# Patient Record
Sex: Male | Born: 1995 | Hispanic: Yes | Marital: Single | Smoking: Never smoker
Health system: Southern US, Community
[De-identification: ages and names within clinical notes are randomized; demographics above are authoritative.]

## PROBLEM LIST (undated history)

## (undated) DIAGNOSIS — I10 Essential (primary) hypertension: Secondary | ICD-10-CM

---
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
## 2016-09-30 ENCOUNTER — Emergency Department: Payer: Self-pay

## 2016-09-30 ENCOUNTER — Emergency Department
Admission: EM | Admit: 2016-09-30 | Discharge: 2016-09-30 | Disposition: A | Discharge status: Home / Self Care | Payer: Self-pay | Attending: Emergency Medicine | Admitting: Emergency Medicine

## 2016-09-30 ENCOUNTER — Encounter: Payer: Self-pay | Admitting: Emergency Medicine

## 2016-09-30 DIAGNOSIS — Z23 Encounter for immunization: Secondary | ICD-10-CM | POA: Insufficient documentation

## 2016-09-30 DIAGNOSIS — Y999 Unspecified external cause status: Secondary | ICD-10-CM | POA: Insufficient documentation

## 2016-09-30 DIAGNOSIS — I10 Essential (primary) hypertension: Secondary | ICD-10-CM | POA: Insufficient documentation

## 2016-09-30 DIAGNOSIS — Y9389 Activity, other specified: Secondary | ICD-10-CM | POA: Insufficient documentation

## 2016-09-30 DIAGNOSIS — Y929 Unspecified place or not applicable: Secondary | ICD-10-CM | POA: Insufficient documentation

## 2016-09-30 DIAGNOSIS — W540XXA Bitten by dog, initial encounter: Secondary | ICD-10-CM | POA: Insufficient documentation

## 2016-09-30 DIAGNOSIS — Z203 Contact with and (suspected) exposure to rabies: Secondary | ICD-10-CM | POA: Insufficient documentation

## 2016-09-30 DIAGNOSIS — S41131A Puncture wound without foreign body of right upper arm, initial encounter: Secondary | ICD-10-CM | POA: Insufficient documentation

## 2016-09-30 DIAGNOSIS — Z2914 Encounter for prophylactic rabies immune globin: Secondary | ICD-10-CM | POA: Insufficient documentation

## 2016-09-30 HISTORY — DX: Essential (primary) hypertension: I10

## 2016-09-30 MED ORDER — AMOXICILLIN-POT CLAVULANATE 875-125 MG PO TABS: 1.0000 | ORAL_TABLET | Freq: Two times a day (BID) | ORAL | 0 refills | Status: DC

## 2016-09-30 MED ORDER — TETANUS-DIPHTH-ACELL PERTUSSIS 5-2.5-18.5 LF-MCG/0.5 IM SUSP
INTRAMUSCULAR | Status: AC
  Administered 2016-09-30: 0.5 mL via INTRAMUSCULAR
  Filled 2016-09-30: qty 0.5

## 2016-09-30 MED ORDER — RABIES VACCINE, PCEC IM SUSR
1.0000 mL | Freq: Once | INTRAMUSCULAR | Status: AC
  Administered 2016-09-30: 1 mL via INTRAMUSCULAR
  Filled 2016-09-30: qty 1

## 2016-09-30 MED ORDER — OXYCODONE-ACETAMINOPHEN 5-325 MG PO TABS
1.0000 | ORAL_TABLET | ORAL | Status: DC | PRN
  Administered 2016-09-30: 1 via ORAL

## 2016-09-30 MED ORDER — RABIES IMMUNE GLOBULIN 150 UNIT/ML IM INJ
20.0000 [IU]/kg | INJECTION | Freq: Once | INTRAMUSCULAR | Status: AC
  Administered 2016-09-30: 2175 [IU] via INTRAMUSCULAR
  Filled 2016-09-30: qty 16

## 2016-09-30 MED ORDER — TETANUS-DIPHTH-ACELL PERTUSSIS 5-2.5-18.5 LF-MCG/0.5 IM SUSP
0.5000 mL | Freq: Once | INTRAMUSCULAR | Status: AC
  Administered 2016-09-30: 0.5 mL via INTRAMUSCULAR

## 2016-09-30 MED ORDER — OXYCODONE-ACETAMINOPHEN 5-325 MG PO TABS
ORAL_TABLET | ORAL | Status: AC
  Filled 2016-09-30: qty 1

## 2016-09-30 NOTE — ED Triage Notes (Addendum)
Patient states that he was trying to cover his dog and the dog bit him. Patient with multiple puncture wounds to right shoulder, upper arm and forearm. Patient with swelling and decreased range of motion to forearm and elbow. Patient with positive radial pulse. Patient with abrasion to left lower abd.  Patient reports that the dog's rabies vaccination is not up to date.

## 2016-09-30 NOTE — ED Provider Notes (Signed)
[facility] Emergency Department Provider Note  ____________________________________________  Time seen: Approximately 11:15 PM  I have reviewed the triage vital signs and the nursing notes.   HISTORY  Chief Complaint Arm Pain and Animal Bite    HPI Gary Good is a 21 y.o. male who presents emergency department complaining of dog bite to the right arm. Patient states that he was messing with his dog when the dog bit him. Does not been acting overly aggressive but is not up-to-date on rabies shots. Patient reports that he is having some pain and swelling to the right forearm. He denies any true loss of range of motion. He has been favoring the arm due to the pain. He denies any numbness or tingling distally. Patient is unsure of his last tetanus shot. Patient is requesting rabies vaccine at this time. Patient denies any bleeding. No other injury or complaint.   Past Medical History:  Diagnosis Date  . Hypertension     There are no active problems to display for this patient.   History reviewed. No pertinent surgical history.  Prior to Admission medications   Medication Sig Start Date End Date Taking? Authorizing Provider  amoxicillin-clavulanate (AUGMENTIN) 875-125 MG tablet Take 1 tablet by mouth 2 (two) times daily. 09/30/16   Delorise RoyalsJonathan D Imane Burrough, PA-C    Allergies Patient has no known allergies.  No family history on file.  Social History Social History  Substance Use Topics  . Smoking status: Never Smoker  . Smokeless tobacco: Never Used  . Alcohol use No     Review of Systems  Constitutional: No fever/chills Cardiovascular: no chest pain. Respiratory: no cough. No SOB. Musculoskeletal: Positive for right forearm pain Skin: Also for multiple puncture wounds to the right arm. Neurological: Negative for headaches, focal weakness or numbness. 10-point ROS otherwise  negative.  ____________________________________________   PHYSICAL EXAM:  VITAL SIGNS: ED Triage Vitals  Enc Vitals Group     BP 09/30/16 2134 (!) 143/91     Pulse Rate 09/30/16 2134 (!) 106     Resp 09/30/16 2134 18     Temp 09/30/16 2134 97.5 F (36.4 C)     Temp Source 09/30/16 2134 Oral     SpO2 09/30/16 2134 97 %     Weight 09/30/16 2135 238 lb (108 kg)     Height 09/30/16 2135 5\' 6"  (1.676 m)     Head Circumference --      Peak Flow --      Pain Score 09/30/16 2135 10     Pain Loc --      Pain Edu? --      Excl. in GC? --      Constitutional: Alert and oriented. Well appearing and in no acute distress. Eyes: Conjunctivae are normal. PERRL. EOMI. Head: Atraumatic. Neck: No stridor.    Cardiovascular: Normal rate, regular rhythm. Normal S1 and S2.  Good peripheral circulation. Respiratory: Normal respiratory effort without tachypnea or retractions. Lungs CTAB. Good air entry to the bases with no decreased or absent breath sounds. Musculoskeletal: Full range of motion to all extremities. No gross deformities appreciated. Full range of motion to the right upper injury. Patient does have edema to the proximal right forearm. Full range of motion of the elbow and wrist. Radial pulse and sensation intact distally. Multiple puncture wounds from dog bite. Neurologic:  Normal speech and language. No gross focal neurologic deficits are appreciated.  Skin:  Skin is warm, dry and intact. No rash  noted. Multiple puncture wounds noted to the right upper extremity. No bleeding. No foreign body in any puncture wound. No tearing or lacerations noted. Patient is tender to palpation over each puncture wound. Radial pulse and sensation intact distally. Psychiatric: Mood and affect are normal. Speech and behavior are normal. Patient exhibits appropriate insight and judgement.   ____________________________________________   LABS (all labs ordered are listed, but only abnormal results are  displayed)  Labs Reviewed - No data to display ____________________________________________  EKG   ____________________________________________  RADIOLOGY Festus Barren Jakie Debow, personally viewed and evaluated these images (plain radiographs) as part of my medical decision making, as well as reviewing the written report by the radiologist.  Dg Forearm Right  Result Date: 09/30/2016 CLINICAL DATA:  Dog bite EXAM: RIGHT FOREARM - 2 VIEW COMPARISON:  None. FINDINGS: Frontal and lateral views were obtained. There is no fracture or dislocation. Joint spaces appear normal. No radiopaque foreign bodies are evident. Incidental note is made of a minus ulnar variance. IMPRESSION: No radiopaque foreign body or soft tissue air. No fracture or dislocation. No apparent arthropathy. Incidental note is made of a minus ulnar variance. Electronically Signed   By: Bretta Bang III M.D.   On: 09/30/2016 22:01    ____________________________________________    PROCEDURES  Procedure(s) performed:    Procedures  The wounds are cleansed. The patient is alerted to watch for any signs of infection (redness, pus, pain, increased swelling or fever) and call if such occurs. Home wound care instructions are provided. Tetanus vaccination status reviewed: Td vaccination indicated and given today.   Medications  oxyCODONE-acetaminophen (PERCOCET/ROXICET) 5-325 MG per tablet 1 tablet (1 tablet Oral Given 09/30/16 2140)  rabies immune globulin (HYPERAB) injection 2,175 Units (2,175 Units Intramuscular Given 09/30/16 2249)  rabies vaccine (RABAVERT) injection 1 mL (1 mL Intramuscular Given 09/30/16 2253)  Tdap (BOOSTRIX) injection 0.5 mL (0.5 mLs Intramuscular Given 09/30/16 2255)     ____________________________________________   INITIAL IMPRESSION / ASSESSMENT AND PLAN / ED COURSE  Pertinent labs & imaging results that were available during my care of the patient were reviewed by me and considered in my  medical decision making (see chart for details).  Review of the [place] CSRS was performed in accordance of the [facility] prior to dispensing any controlled drugs.  Clinical Course     Patient's diagnosis is consistent with Dog bite to the right upper extremity. Due to the nature of the injury, no closure of any puncture wound. No tearing requiring sutures stabilization. Patient is given rabies vaccine and immunoglobin tonight. His tetanus shot is updated at this time. Patient is given instructions to follow up on day 3, 7, 14 for further rabies vaccine.. Patient will be discharged home with prescriptions for Augmentin. Patient is given ED precautions to return to the ED for any worsening or new symptoms.     ____________________________________________  FINAL CLINICAL IMPRESSION(S) / ED DIAGNOSES  Final diagnoses:  Dog bite, initial encounter  Need for rabies vaccination      NEW MEDICATIONS STARTED DURING THIS VISIT:  New Prescriptions   AMOXICILLIN-CLAVULANATE (AUGMENTIN) 875-125 MG TABLET    Take 1 tablet by mouth 2 (two) times daily.        This chart was dictated using voice recognition software/Dragon. Despite best efforts to proofread, errors can occur which can change the meaning. Any change was purely unintentional.    Racheal Patches, PA-C 09/30/16 1610    Minna Antis, MD 09/30/16 [phone]

## 2016-09-30 NOTE — ED Notes (Signed)
Pt discharged to home.  Family member driving.  Discharge instructions reviewed.  Verbalized understanding.  No questions or concerns at this time.  Teach back verified.  Pt in NAD.  No items left in ED.   

## 2016-10-03 ENCOUNTER — Encounter: Payer: Self-pay | Admitting: Emergency Medicine

## 2016-10-03 ENCOUNTER — Emergency Department
Admission: EM | Admit: 2016-10-03 | Discharge: 2016-10-03 | Disposition: A | Discharge status: Home / Self Care | Payer: Self-pay | Attending: Emergency Medicine | Admitting: Emergency Medicine

## 2016-10-03 DIAGNOSIS — Z203 Contact with and (suspected) exposure to rabies: Secondary | ICD-10-CM | POA: Insufficient documentation

## 2016-10-03 DIAGNOSIS — I10 Essential (primary) hypertension: Secondary | ICD-10-CM | POA: Insufficient documentation

## 2016-10-03 DIAGNOSIS — Z23 Encounter for immunization: Secondary | ICD-10-CM | POA: Insufficient documentation

## 2016-10-03 MED ORDER — RABIES VACCINE, PCEC IM SUSR
1.0000 mL | Freq: Once | INTRAMUSCULAR | Status: AC
  Administered 2016-10-03: 1 mL via INTRAMUSCULAR
  Filled 2016-10-03: qty 1

## 2016-10-03 NOTE — ED Notes (Signed)
Wounds to right arm and shoulder healing, pt reports minimal pain, no drainage, swelling, or redness noted.

## 2016-10-03 NOTE — ED Triage Notes (Signed)
Needs next dose of the rabies injection. No issues with initial dose. Wounds healing, no issues.

## 2016-10-03 NOTE — ED Provider Notes (Signed)
[facility] Emergency Department Provider Note  ____________________________________________  Time seen: Approximately 6:43 PM  I have reviewed the triage vital signs and the nursing notes.   HISTORY  Chief Complaint Rabies Injection    HPI Gary Good is a 21 y.o. male presents to the emergency department for second rabies vaccine. Patient was bit by a dog on Wednesday and received the rabies immunoglobulin and rabies vaccine. Patient denies any swelling, drainage, pain at bite site. Patient is not having any difficulty moving right arm. Patient denies any complications from first rabies vaccine.   Past Medical History:  Diagnosis Date  . Hypertension     There are no active problems to display for this patient.   History reviewed. No pertinent surgical history.  Prior to Admission medications   Medication Sig Start Date End Date Taking? Authorizing Provider  amoxicillin-clavulanate (AUGMENTIN) 875-125 MG tablet Take 1 tablet by mouth 2 (two) times daily. 09/30/16   Delorise Royals Cuthriell, PA-C    Allergies Patient has no known allergies.  History reviewed. No pertinent family history.  Social History Social History  Substance Use Topics  . Smoking status: Never Smoker  . Smokeless tobacco: Never Used  . Alcohol use No     Review of Systems  Constitutional: No fever/chills ENT: No upper respiratory complaints. Cardiovascular: No chest pain. Respiratory: No SOB. Gastrointestinal: No abdominal pain.   Musculoskeletal: Negative for musculoskeletal pain. Neurological: Negative for  numbness or tingling   ____________________________________________   PHYSICAL EXAM:  VITAL SIGNS: ED Triage Vitals  Enc Vitals Group     BP 10/03/16 1751 (!) 151/78     Pulse Rate 10/03/16 1751 95     Resp 10/03/16 1751 18     Temp 10/03/16 1751 (!) 96 F (35.6 C)     Temp Source 10/03/16 1751 Oral     SpO2 10/03/16 1751 99 %     Weight  10/03/16 1751 238 lb (108 kg)     Height 10/03/16 1751 5\' 6"  (1.676 m)     Head Circumference --      Peak Flow --      Pain Score 10/03/16 1752 0     Pain Loc --      Pain Edu? --      Excl. in GC? --      Constitutional: Alert and oriented. Well appearing and in no acute distress. Eyes: Conjunctivae are normal. PERRL. EOMI. Head: Atraumatic. ENT:      Ears:      Nose: No congestion/rhinnorhea.      Mouth/Throat: Mucous membranes are moist.  Neck: No stridor.   Cardiovascular: Normal rate, regular rhythm. Normal S1 and S2.  Good peripheral circulation. 2+ radial pulses. Respiratory: Normal respiratory effort without tachypnea or retractions. Lungs CTAB. Good air entry to the bases with no decreased or absent breath sounds. Musculoskeletal: Full range of motion to all extremities. No gross deformities appreciated. Neurologic:  Normal speech and language. No gross focal neurologic deficits are appreciated.  Skin:  Multiple well-healing lesions across right arm. No erythema or swelling. No drainage. Psychiatric: Mood and affect are normal. Speech and behavior are normal. Patient exhibits appropriate insight and judgement.   ____________________________________________   LABS (all labs ordered are listed, but only abnormal results are displayed)  Labs Reviewed - No data to display ____________________________________________  EKG   ____________________________________________  RADIOLOGY  No results found.  ____________________________________________    PROCEDURES  Procedure(s) performed:    Procedures  Medications  rabies vaccine (RABAVERT) injection 1 mL (1 mL Intramuscular Given 10/03/16 1850)     ____________________________________________   INITIAL IMPRESSION / ASSESSMENT AND PLAN / ED COURSE  Pertinent labs & imaging results that were available during my care of the patient were reviewed by me and considered in my medical decision making (see  chart for details).  Review of the [place] CSRS was performed in accordance of the [facility] prior to dispensing any controlled drugs.  Clinical Course     Patient presented to the emergency department for second rabies vaccine. Patient was instructed to return to the emergency department for third and fourth vaccine. Patient is given ED precautions to return to the ED for any worsening or new symptoms.     ____________________________________________  FINAL CLINICAL IMPRESSION(S) / ED DIAGNOSES  Final diagnoses:  Need for rabies vaccination      NEW MEDICATIONS STARTED DURING THIS VISIT:  Discharge Medication List as of 10/03/2016  7:08 PM          This chart was dictated using voice recognition software/Dragon. Despite best efforts to proofread, errors can occur which can change the meaning. Any change was purely unintentional.    Enid Derryshley Knolan Simien, PA-C 10/03/16 2006    Sharman CheekPhillip Stafford, MD 10/04/16 [phone]

## 2016-10-07 ENCOUNTER — Emergency Department
Admission: EM | Admit: 2016-10-07 | Discharge: 2016-10-07 | Disposition: A | Discharge status: Home / Self Care | Payer: Self-pay | Attending: Student in an Organized Health Care Education/Training Program | Admitting: Student in an Organized Health Care Education/Training Program

## 2016-10-07 DIAGNOSIS — I10 Essential (primary) hypertension: Secondary | ICD-10-CM | POA: Insufficient documentation

## 2016-10-07 DIAGNOSIS — Z792 Long term (current) use of antibiotics: Secondary | ICD-10-CM | POA: Insufficient documentation

## 2016-10-07 DIAGNOSIS — Z23 Encounter for immunization: Secondary | ICD-10-CM | POA: Insufficient documentation

## 2016-10-07 MED ORDER — RABIES VACCINE, PCEC IM SUSR
1.0000 mL | Freq: Once | INTRAMUSCULAR | Status: AC
  Administered 2016-10-07: 1 mL via INTRAMUSCULAR
  Filled 2016-10-07: qty 1

## 2016-10-07 NOTE — ED Provider Notes (Signed)
[facility] Emergency Department Provider Note  ____________________________________________  Time seen: Approximately 9:46 PM  I have reviewed the triage vital signs and the nursing notes.   HISTORY  Chief Complaint Rabies Injection    HPI Gary Good is a 21 y.o. male who presents to the emergency department for his third rabies vaccine. Patient was initially seen by myself following a dog bite. See previous note for details. Patient presents tonight seeking his third rabies vaccine. Patient denies any complications to bite wounds. No other symptoms at this time.   Past Medical History:  Diagnosis Date  . Hypertension     There are no active problems to display for this patient.   History reviewed. No pertinent surgical history.  Prior to Admission medications   Medication Sig Start Date End Date Taking? Authorizing Provider  amoxicillin-clavulanate (AUGMENTIN) 875-125 MG tablet Take 1 tablet by mouth 2 (two) times daily. 09/30/16   Delorise RoyalsJonathan D Cuthriell, PA-C    Allergies Patient has no known allergies.  No family history on file.  Social History Social History  Substance Use Topics  . Smoking status: Never Smoker  . Smokeless tobacco: Never Used  . Alcohol use No     Review of Systems  Constitutional: No fever/chills Cardiovascular: no chest pain. Respiratory: no cough. No SOB. Gastrointestinal: No abdominal pain.  No nausea, no vomiting.  No diarrhea.  No constipation. Musculoskeletal: Negative for musculoskeletal pain. Skin: Negative for rash, abrasions, lacerations, ecchymosis. Neurological: Negative for headaches, focal weakness or numbness. 10-point ROS otherwise negative.  ____________________________________________   PHYSICAL EXAM:  VITAL SIGNS: ED Triage Vitals  Enc Vitals Group     BP 10/07/16 2122 135/86     Pulse Rate 10/07/16 2122 66     Resp 10/07/16 2122 16     Temp 10/07/16 2122 98.2 F (36.8 C)     Temp Source 10/07/16 2122 Oral     SpO2 10/07/16 2122 99 %     Weight 10/07/16 2123 238 lb (108 kg)     Height 10/07/16 2123 5\' 6"  (1.676 m)     Head Circumference --      Peak Flow --      Pain Score 10/07/16 2123 0     Pain Loc --      Pain Edu? --      Excl. in GC? --      Constitutional: Alert and oriented. Well appearing and in no acute distress. Eyes: Conjunctivae are normal. PERRL. EOMI. Head: Atraumatic. Neck: No stridor.    Cardiovascular: Normal rate, regular rhythm. Normal S1 and S2.  Good peripheral circulation. Respiratory: Normal respiratory effort without tachypnea or retractions. Lungs CTAB. Good air entry to the bases with no decreased or absent breath sounds. Musculoskeletal: Full range of motion to all extremities. No gross deformities appreciated. Neurologic:  Normal speech and language. No gross focal neurologic deficits are appreciated.  Skin:  Skin is warm, dry and intact. No rash noted.Bite wounds to the right forearm are healing appropriately with no signs of infection. Psychiatric: Mood and affect are normal. Speech and behavior are normal. Patient exhibits appropriate insight and judgement.   ____________________________________________   LABS (all labs ordered are listed, but only abnormal results are displayed)  Labs Reviewed - No data to display ____________________________________________  EKG   ____________________________________________  RADIOLOGY   No results found.  ____________________________________________    PROCEDURES  Procedure(s) performed:    Procedures    Medications  rabies vaccine (RABAVERT) injection  1 mL (1 mL Intramuscular Given 10/07/16 2156)     ____________________________________________   INITIAL IMPRESSION / ASSESSMENT AND PLAN / ED COURSE  Pertinent labs & imaging results that were available during my care of the patient were reviewed by me and considered in my medical decision making (see  chart for details).  Review of the [employer] CSRS was performed in accordance of the [facility] prior to dispensing any controlled drugs.  Clinical Course     Patient's diagnosis is consistent with Encounter for rabies vaccination. This is patient's third vaccination in the series. Dog bite wounds are healing appropriately with no signs of infection. Patient will follow-up in one week for last rabies vaccine.. Patient is given ED precautions to return to the ED for any worsening or new symptoms.     ____________________________________________  FINAL CLINICAL IMPRESSION(S) / ED DIAGNOSES  Final diagnoses:  Need for rabies vaccination      NEW MEDICATIONS STARTED DURING THIS VISIT:  Discharge Medication List as of 10/07/2016  9:47 PM          This chart was dictated using voice recognition software/Dragon. Despite best efforts to proofread, errors can occur which can change the meaning. Any change was purely unintentional.    Racheal Patches, PA-C 10/08/16 0139    Willy Eddy, MD 10/08/16 [phone]

## 2016-10-07 NOTE — ED Triage Notes (Signed)
Pt here for 3 round of rabies vaccine.  A/OX4, resp and unlabored. NAD

## 2016-10-16 ENCOUNTER — Emergency Department
Admission: EM | Admit: 2016-10-16 | Discharge: 2016-10-16 | Disposition: A | Discharge status: Home / Self Care | Payer: Self-pay | Attending: Emergency Medicine | Admitting: Emergency Medicine

## 2016-10-16 ENCOUNTER — Encounter: Payer: Self-pay | Admitting: Emergency Medicine

## 2016-10-16 DIAGNOSIS — Z203 Contact with and (suspected) exposure to rabies: Secondary | ICD-10-CM | POA: Insufficient documentation

## 2016-10-16 DIAGNOSIS — Z23 Encounter for immunization: Secondary | ICD-10-CM | POA: Insufficient documentation

## 2016-10-16 DIAGNOSIS — I10 Essential (primary) hypertension: Secondary | ICD-10-CM | POA: Insufficient documentation

## 2016-10-16 MED ORDER — RABIES VACCINE, PCEC IM SUSR
1.0000 mL | Freq: Once | INTRAMUSCULAR | Status: AC
  Administered 2016-10-16: 1 mL via INTRAMUSCULAR
  Filled 2016-10-16: qty 1

## 2016-10-16 NOTE — ED Notes (Signed)
See triage note  Here for rabies vaccine 

## 2016-10-16 NOTE — ED Provider Notes (Signed)
[facility] Emergency Department Provider Note ____________________________________________  Time seen: 1615  I have reviewed the triage vital signs and the nursing notes.  HISTORY  Chief Complaint  Rabies Injection  HPI Gary Good is a 21 y.o. male resistance to the ED for rabies vaccine #4 of 4. The patient denies any interim complaints. He is finishing his previously prescribed antibiotic without any problems.  Past Medical History:  Diagnosis Date  . Hypertension     There are no active problems to display for this patient.   History reviewed. No pertinent surgical history.  Prior to Admission medications   Medication Sig Start Date End Date Taking? Authorizing Provider  amoxicillin-clavulanate (AUGMENTIN) 875-125 MG tablet Take 1 tablet by mouth 2 (two) times daily. 09/30/16   Delorise RoyalsJonathan D Cuthriell, PA-C    Allergies Patient has no known allergies.  No family history on file.  Social History Social History  Substance Use Topics  . Smoking status: Never Smoker  . Smokeless tobacco: Never Used  . Alcohol use No    Review of Systems  Constitutional: Negative for fever. Cardiovascular: Negative for chest pain. Respiratory: Negative for shortness of breath. Gastrointestinal: Negative for abdominal pain, vomiting and diarrhea. Musculoskeletal: Negative for back pain. Skin: Negative for rash. Neurological: Negative for headaches, focal weakness or numbness. ____________________________________________  PHYSICAL EXAM:  VITAL SIGNS: ED Triage Vitals  Enc Vitals Group     BP 10/16/16 1501 (!) 173/80     Pulse Rate 10/16/16 1501 (!) 107     Resp 10/16/16 1501 (!) 21     Temp --      Temp src --      SpO2 10/16/16 1501 98 %     Weight 10/16/16 1459 238 lb (108 kg)     Height 10/16/16 1459 5\' 6"  (1.676 m)     Head Circumference --      Peak Flow --      Pain Score 10/16/16 1459 0     Pain Loc --      Pain Edu? --      Excl.  in GC? --    Constitutional: Alert and oriented. Well appearing and in no distress. Head: Normocephalic and atraumatic. Eyes: Conjunctivae are normal. PERRL. Normal extraocular movements Respiratory: Normal respiratory effort.  Musculoskeletal: Nontender with normal range of motion in all extremities.  Neurologic:  Normal gait without ataxia. Normal speech and language. No gross focal neurologic deficits are appreciated. Skin:  Skin is warm, dry and intact. No rash noted. Psychiatric: Mood and affect are normal. Patient exhibits appropriate insight and judgment. ____________________________________________  PROCEDURES  Rabies Vaccine 1 ml IM ____________________________________________  INITIAL IMPRESSION / ASSESSMENT AND PLAN / ED COURSE  Patient for completion of post-exposure prophylaxis for rabies. [facility]   ____________________________________________  FINAL CLINICAL IMPRESSION(S) / ED DIAGNOSES  Final diagnoses:  Encounter for repeat administration of rabies vaccination      Lissa HoardJenise V Bacon Klaus Casteneda, PA-C 10/16/16 1622    Sharyn CreamerMark Quale, MD 10/17/16 2337

## 2016-10-16 NOTE — ED Triage Notes (Signed)
Pt here for 4th set of rabies shot.

## 2017-05-29 ENCOUNTER — Encounter: Payer: Self-pay | Admitting: Emergency Medicine

## 2017-05-29 ENCOUNTER — Emergency Department
Admission: EM | Admit: 2017-05-29 | Discharge: 2017-05-29 | Disposition: A | Discharge status: Home / Self Care | Payer: Self-pay | Attending: Emergency Medicine | Admitting: Emergency Medicine

## 2017-05-29 DIAGNOSIS — S61210D Laceration without foreign body of right index finger without damage to nail, subsequent encounter: Secondary | ICD-10-CM | POA: Insufficient documentation

## 2017-05-29 DIAGNOSIS — Z4802 Encounter for removal of sutures: Secondary | ICD-10-CM | POA: Insufficient documentation

## 2017-05-29 DIAGNOSIS — I10 Essential (primary) hypertension: Secondary | ICD-10-CM | POA: Insufficient documentation

## 2017-05-29 DIAGNOSIS — X58XXXD Exposure to other specified factors, subsequent encounter: Secondary | ICD-10-CM | POA: Insufficient documentation

## 2017-05-29 DIAGNOSIS — Z79899 Other long term (current) drug therapy: Secondary | ICD-10-CM | POA: Insufficient documentation

## 2017-05-29 NOTE — ED Notes (Signed)
Sutures removed; pt tolerated it well. No apparent distress noted.

## 2017-05-29 NOTE — ED Triage Notes (Signed)
Pt to ed for suture removal from right hand second digit.

## 2017-05-29 NOTE — ED Notes (Signed)

## 2017-05-29 NOTE — ED Provider Notes (Signed)
[facility] Emergency Department Provider Note  ____________________________________________  Time seen: Approximately 3:26 PM  I have reviewed the triage vital signs and the nursing notes.   HISTORY  Chief Complaint Suture / Staple Removal    HPI Gary Good is a 21 y.o. male presenting to the emergency department for suture removal of the right second digit. He denies discharge or purulent exudate from healing laceration. Patient has healed without incident. He has no concerns.   Past Medical History:  Diagnosis Date  . Hypertension     There are no active problems to display for this patient.   History reviewed. No pertinent surgical history.  Prior to Admission medications   Medication Sig Start Date End Date Taking? Authorizing Provider  amoxicillin-clavulanate (AUGMENTIN) 875-125 MG tablet Take 1 tablet by mouth 2 (two) times daily. 09/30/16   Cuthriell, Delorise Royals, PA-C    Allergies Patient has no known allergies.  History reviewed. No pertinent family history.  Social History Social History  Substance Use Topics  . Smoking status: Never Smoker  . Smokeless tobacco: Never Used  . Alcohol use No     Review of Systems  Constitutional: No fever/chills Eyes: No visual changes. No discharge ENT: No upper respiratory complaints. Cardiovascular: no chest pain. Respiratory: no cough. No SOB. Skin: Patient has healing right 2nd digit laceration.  Neurological: Negative for headaches, focal weakness or numbness.   ____________________________________________   PHYSICAL EXAM:  VITAL SIGNS: ED Triage Vitals [05/29/17 1452]  Enc Vitals Group     BP (!) 150/88     Pulse Rate 68     Resp 18     Temp 98 F (36.7 C)     Temp Source Oral     SpO2 98 %     Weight 238 lb (108 kg)     Height      Head Circumference      Peak Flow      Pain Score 0     Pain Loc      Pain Edu?      Excl. in GC?     Constitutional: Alert  and oriented. Well appearing and in no acute distress. Eyes: Conjunctivae are normal. PERRL. EOMI. Head: Atraumatic. Cardiovascular: Normal rate, regular rhythm. Normal S1 and S2.  Good peripheral circulation. Respiratory: Normal respiratory effort without tachypnea or retractions. Lungs CTAB. Good air entry to the bases with no decreased or absent breath sounds. Musculoskeletal: Full range of motion to all extremities. No gross deformities appreciated. Neurologic:  Normal speech and language. No gross focal neurologic deficits are appreciated.  Skin: Patient has healing right 2nd digit laceration.  ____________________________________________   LABS (all labs ordered are listed, but only abnormal results are displayed)  Labs Reviewed - No data to display ____________________________________________  EKG   ____________________________________________  RADIOLOGY   No results found.  ____________________________________________    PROCEDURES  Procedure(s) performed:    Procedures    Medications - No data to display   ____________________________________________   INITIAL IMPRESSION / ASSESSMENT AND PLAN / ED COURSE  Pertinent labs & imaging results that were available during my care of the patient were reviewed by me and considered in my medical decision making (see chart for details).  Review of the [place] CSRS was performed in accordance of the [facility] prior to dispensing any controlled drugs.    Assessment and Plan:  Suture Removal  Patient underwent suture removal from the right second digit without complication. All patient  questions were answered. ____________________________________________  FINAL CLINICAL IMPRESSION(S) / ED DIAGNOSES  Final diagnoses:  Visit for suture removal      NEW MEDICATIONS STARTED DURING THIS VISIT:  New Prescriptions   No medications on file        This chart was dictated using voice recognition software/Dragon.  Despite best efforts to proofread, errors can occur which can change the meaning. Any change was purely unintentional.    Orvil FeilWoods, Leo Weyandt M, PA-C 05/29/17 1535    Arnaldo NatalMalinda, Paul F, MD 05/30/17 2120

## 2018-08-27 ENCOUNTER — Other Ambulatory Visit: Payer: Self-pay

## 2018-08-27 ENCOUNTER — Emergency Department: Payer: Self-pay

## 2018-08-27 ENCOUNTER — Emergency Department
Admission: EM | Admit: 2018-08-27 | Discharge: 2018-08-27 | Disposition: A | Discharge status: Home / Self Care | Payer: Self-pay | Attending: Emergency Medicine | Admitting: Emergency Medicine

## 2018-08-27 ENCOUNTER — Encounter: Payer: Self-pay | Admitting: Emergency Medicine

## 2018-08-27 DIAGNOSIS — I10 Essential (primary) hypertension: Secondary | ICD-10-CM | POA: Insufficient documentation

## 2018-08-27 DIAGNOSIS — L02611 Cutaneous abscess of right foot: Secondary | ICD-10-CM | POA: Insufficient documentation

## 2018-08-27 DIAGNOSIS — R7309 Other abnormal glucose: Secondary | ICD-10-CM

## 2018-08-27 LAB — CBC WITH DIFFERENTIAL/PLATELET
ABS IMMATURE GRANULOCYTES: 0.06 10*3/uL (ref 0.00–0.07)
BASOS ABS: 0.1 10*3/uL (ref 0.0–0.1)
Basophils Relative: 0 %
Eosinophils Absolute: 0.2 10*3/uL (ref 0.0–0.5)
Eosinophils Relative: 2 %
HCT: 49.8 % (ref 39.0–52.0)
HEMOGLOBIN: 16.1 g/dL (ref 13.0–17.0)
IMMATURE GRANULOCYTES: 1 %
LYMPHS PCT: 23 %
Lymphs Abs: 2.7 10*3/uL (ref 0.7–4.0)
MCH: 27.2 pg (ref 26.0–34.0)
MCHC: 32.3 g/dL (ref 30.0–36.0)
MCV: 84.3 fL (ref 80.0–100.0)
MONO ABS: 0.7 10*3/uL (ref 0.1–1.0)
Monocytes Relative: 6 %
NEUTROS ABS: 8.1 10*3/uL — AB (ref 1.7–7.7)
NEUTROS PCT: 68 %
Platelets: 273 10*3/uL (ref 150–400)
RBC: 5.91 MIL/uL — AB (ref 4.22–5.81)
RDW: 12.4 % (ref 11.5–15.5)
WBC: 11.8 10*3/uL — ABNORMAL HIGH (ref 4.0–10.5)
nRBC: 0 % (ref 0.0–0.2)

## 2018-08-27 LAB — COMPREHENSIVE METABOLIC PANEL
ALBUMIN: 4.5 g/dL (ref 3.5–5.0)
ALK PHOS: 74 U/L (ref 38–126)
ALT: 57 U/L — AB (ref 0–44)
ANION GAP: 11 (ref 5–15)
AST: 28 U/L (ref 15–41)
BILIRUBIN TOTAL: 0.4 mg/dL (ref 0.3–1.2)
BUN: 9 mg/dL (ref 6–20)
CALCIUM: 9.1 mg/dL (ref 8.9–10.3)
CO2: 22 mmol/L (ref 22–32)
CREATININE: 0.69 mg/dL (ref 0.61–1.24)
Chloride: 103 mmol/L (ref 98–111)
GFR calc non Af Amer: >60 mL/min (ref >60)
GLUCOSE: 234 mg/dL — AB (ref 70–99)
Potassium: 3.7 mmol/L (ref 3.5–5.1)
Sodium: 136 mmol/L (ref 135–145)
TOTAL PROTEIN: 7.9 g/dL (ref 6.5–8.1)

## 2018-08-27 LAB — CG4 I-STAT (LACTIC ACID): LACTIC ACID, VENOUS: 1.75 mmol/L (ref 0.5–1.9)

## 2018-08-27 LAB — CK: CK TOTAL: 108 U/L (ref 49–397)

## 2018-08-27 MED ORDER — HYDROCODONE-ACETAMINOPHEN 5-325 MG PO TABS: 1.0000 | ORAL_TABLET | ORAL | 0 refills | Status: AC | PRN

## 2018-08-27 MED ORDER — CLINDAMYCIN HCL 300 MG PO CAPS: 300.0000 mg | ORAL_CAPSULE | Freq: Four times a day (QID) | ORAL | 0 refills | Status: AC

## 2018-08-27 MED ORDER — CLINDAMYCIN PHOSPHATE 900 MG/50ML IV SOLN
900.0000 mg | Freq: Once | INTRAVENOUS | Status: AC
  Administered 2018-08-27: 900 mg via INTRAVENOUS
  Filled 2018-08-27: qty 50

## 2018-08-27 MED ORDER — SODIUM CHLORIDE 0.9 % IV BOLUS
1000.0000 mL | Freq: Once | INTRAVENOUS | Status: AC
  Administered 2018-08-27: 1000 mL via INTRAVENOUS

## 2018-08-27 MED ORDER — IOPAMIDOL (ISOVUE-300) INJECTION 61%
100.0000 mL | Freq: Once | INTRAVENOUS | Status: AC | PRN
  Administered 2018-08-27: 100 mL via INTRAVENOUS
  Filled 2018-08-27: qty 100

## 2018-08-27 NOTE — ED Triage Notes (Signed)
Pt reports that he noticed some pain and a cut from unknown source on his right middle toe. Pt has noted swelling and wound of the same toe. Pt is in NAD.

## 2018-08-27 NOTE — ED Provider Notes (Signed)
[facility] Emergency Department Provider Note  ____________________________________________  Time seen: Approximately 7:33 PM  I have reviewed the triage vital signs and the nursing notes.   HISTORY  Chief Complaint Wound Infection    HPI Gary Good is a 22 y.o. male who presents the emergency department for an infected third digit of the right foot.  Patient reports that he noticed erythema and edema to the third digit of his right foot yesterday.  Patient reports that today the swelling and erythema increased, and open wound formed to the plantar aspect of the digit as well as extending erythema, edema, pain into the midfoot region.  Patient denies any trauma to the digit.  He denies stepping on anything.  Patient  denies any history of diabetes.  He denies any previous skin infections.  Patient denies any fevers or chills, chest pain, shortness of breath, abdominal pain, nausea or vomiting.  No medications for his complaint prior to arrival.  Patient reports he is up-to-date on all immunizations.   Past Medical History:  Diagnosis Date  . Hypertension     There are no active problems to display for this patient.   History reviewed. No pertinent surgical history.  Prior to Admission medications   Medication Sig Start Date End Date Taking? Authorizing Provider  amoxicillin-clavulanate (AUGMENTIN) 875-125 MG tablet Take 1 tablet by mouth 2 (two) times daily. 09/30/16   Cuthriell, Delorise Royals, PA-C  clindamycin (CLEOCIN) 300 MG capsule Take 1 capsule (300 mg total) by mouth 4 (four) times daily. 08/27/18   Cuthriell, Delorise Royals, PA-C  HYDROcodone-acetaminophen (NORCO/VICODIN) 5-325 MG tablet Take 1 tablet by mouth every 4 (four) hours as needed for moderate pain. 08/27/18   Cuthriell, Delorise Royals, PA-C    Allergies Patient has no known allergies.  No family history on file.  Social History Social History   Tobacco Use  . Smoking status: Never  Smoker  . Smokeless tobacco: Never Used  Substance Use Topics  . Alcohol use: No  . Drug use: No     Review of Systems  Constitutional: No fever/chills Eyes: No visual changes.  Cardiovascular: no chest pain. Respiratory: no cough. No SOB. Gastrointestinal: No abdominal pain.  No nausea, no vomiting.  Musculoskeletal: Positive for pain, erythema, edema to the third digit of the right foot with and now open wound without trauma, pain, erythema, edema extends into the metatarsal region. Skin: Negative for rash, abrasions, lacerations, ecchymosis. Neurological: Negative for headaches, focal weakness or numbness. 10-point ROS otherwise negative.  ____________________________________________   PHYSICAL EXAM:  VITAL SIGNS: ED Triage Vitals  Enc Vitals Group     BP 08/27/18 1911 (!) 171/101     Pulse Rate 08/27/18 1911 98     Resp 08/27/18 1911 20     Temp 08/27/18 1911 99.1 F (37.3 C)     Temp Source 08/27/18 1911 Oral     SpO2 08/27/18 1911 98 %     Weight 08/27/18 1912 230 lb (104.3 kg)     Height 08/27/18 1912 5\' 6"  (1.676 m)     Head Circumference --      Peak Flow --      Pain Score 08/27/18 1911 5     Pain Loc --      Pain Edu? --      Excl. in GC? --      Constitutional: Alert and oriented. Well appearing and in no acute distress. Eyes: Conjunctivae are normal. PERRL. EOMI. Head: Atraumatic. ENT:  Ears:       Nose: No congestion/rhinnorhea.      Mouth/Throat: Mucous membranes are moist.  Neck: No stridor.    Cardiovascular: Normal rate, regular rhythm. Normal S1 and S2.  Good peripheral circulation. Respiratory: Normal respiratory effort without tachypnea or retractions. Lungs CTAB. Good air entry to the bases with no decreased or absent breath sounds. Musculoskeletal: Full range of motion to all extremities. No gross deformities appreciated.  Visualization of the right foot reveals edema, erythema grossly compared to nonaffected foot.  Patient has  significant erythema, edema to the third digit with an open nontraumatic skin lesion to the plantar aspect.  Area is oozing both pus and serosanguineous fluid.  Palpation does not drastically increase pus or serosanguineous fluid drainage.  Area is exquisitely tender to palpation.  Erythema, edema extends from digit into the midfoot region.  Erythema and edema and approximately over the metatarsal head region.  Significant pain with any flexion or extension of the digit.  Sensation intact to the digit.  Capillary refill intact to the digit.  No other visible abnormality to the right foot.  Dorsalis pedis pulse intact. Neurologic:  Normal speech and language. No gross focal neurologic deficits are appreciated.  Skin:  Skin is warm, dry and intact. No rash noted. Psychiatric: Mood and affect are normal. Speech and behavior are normal. Patient exhibits appropriate insight and judgement.   ____________________________________________   LABS (all labs ordered are listed, but only abnormal results are displayed)  Labs Reviewed  COMPREHENSIVE METABOLIC PANEL - Abnormal; Notable for the following components:      Result Value   Glucose, Bld 234 (*)    ALT 57 (*)    All other components within normal limits  CBC WITH DIFFERENTIAL/PLATELET - Abnormal; Notable for the following components:   WBC 11.8 (*)    RBC 5.91 (*)    Neutro Abs 8.1 (*)    All other components within normal limits  CULTURE, BLOOD (ROUTINE X 2)  CULTURE, BLOOD (ROUTINE X 2)  CK  C-REACTIVE PROTEIN  I-STAT CG4 LACTIC ACID, ED  CG4 I-STAT (LACTIC ACID)   ____________________________________________  EKG   ____________________________________________  RADIOLOGY I personally viewed and evaluated these images as part of my medical decision making, as well as reviewing the written report by the radiologist.  Ct Foot Right W Contrast  Result Date: 08/27/2018 CLINICAL DATA:  Pt reports that he noticed some pain and a cut  from unknown source on his right middle toe. Pt has noted swelling and wound of the same toe. EXAM: CT OF THE LOWER RIGHT EXTREMITY WITH CONTRAST TECHNIQUE: Multidetector CT imaging of the lower right extremity was performed according to the standard protocol following intravenous contrast administration. COMPARISON:  None. CONTRAST:  ISOVUE-300 IOPAMIDOL (ISOVUE-300) INJECTION 61% FINDINGS: Bones/Joint/Cartilage No fracture.  No bone lesion. There is no bone resorption to suggest osteomyelitis. Joints are normally spaced and aligned.  No arthropathic changes. Ligaments No evidence of ligament disruption. Ligaments suboptimally assessed by CT. Muscles and Tendons Normal attenuation of the intrinsic foot musculature. Normal appearance of the ankle and foot tendons. Soft tissues There is soft tissue swelling of the third toe primarily along its plantar, proximal aspect. In this area there is somewhat well-defined fluid attenuation, which may reflect an abscess. There is no radiopaque foreign body. Is no soft tissue air as noted above, there is no evidence osteomyelitis the phalanges of the third toe. There is focal edema along the plantar margin the lateral  forefoot adjacent to the fifth metatarsophalangeal joint. Milder subcutaneous edema is noted throughout the forefoot. IMPRESSION: 1. No evidence of osteomyelitis. 2. Third toe is swollen with an area somewhat well-defined fluid attenuation, which lies along the plantar aspect of the proximal third toe. This may reflect an abscess. There is no radiopaque foreign body and there is no soft tissue air. Electronically Signed   By: Amie Portland M.D.   On: 08/27/2018 21:45   Dg Foot Complete Right  Result Date: 08/27/2018 CLINICAL DATA:  infection, pain/edema/erythema to 3rd digit extending into metatarsal region; nki EXAM: RIGHT FOOT COMPLETE - 3+ VIEW COMPARISON:  None. FINDINGS: No fracture or bone lesion. The joints are normally spaced and aligned. No  arthropathic changes. There is soft tissue swelling that is most evident along the forefoot. No soft tissue air. IMPRESSION: 1. Diffuse soft tissue swelling the forefoot, but no fracture, bone lesion or joint abnormality. Electronically Signed   By: Amie Portland M.D.   On: 08/27/2018 20:03    ____________________________________________    PROCEDURES  Procedure(s) performed:    Procedures    Medications  clindamycin (CLEOCIN) IVPB 900 mg (0 mg Intravenous Stopped 08/27/18 2146)  sodium chloride 0.9 % bolus 1,000 mL (1,000 mLs Intravenous New Bag/Given 08/27/18 2030)  iopamidol (ISOVUE-300) 61 % injection 100 mL (100 mLs Intravenous Contrast Given 08/27/18 2034)     ____________________________________________   INITIAL IMPRESSION / ASSESSMENT AND PLAN / ED COURSE  Pertinent labs & imaging results that were available during my care of the patient were reviewed by me and considered in my medical decision making (see chart for details).  Review of the [place] CSRS was performed in accordance of the NCM[facility] prior to dispensing any controlled drugs.  Clinical Course as of Aug 28 2223  Sat Aug 27, 2018  2005 Patient presents the emergency department with erythematous, edematous, painful toe extending into the midfoot right foot.  Patient denies any trauma to the area but there is an open wound to the plantar aspect of the toe.  This is oozing pus and serosanguineous fluid.  Differential at this time includes cellulitis, abscess, infectious tenosynovitis, gangrene, necrotizing fasciitis, necrotizing cellulitis.  Given findings, patient will be evaluated with blood cultures, labs, x-ray and CT of the right foot.  Patient will be started on antibiotics empirically.   [JC]  2214 Patient's labs and imaging return at this time.  Patient does not have an elevated lactic.  Mildly elevated white blood cell count left shift.  Patient does also have an elevated blood glucose reading.  CT scan reveals no  soft tissue air.  There are findings consistent with cellulitis/abscess.  As this is an open draining wound, no indication for drainage at this time.  Given initial findings, I was concerned for necrotizing cellulitis.  I consulted attending provider, Dr. Derrill Kay who comes over and evaluate the patient as well.  At this time, it appears that this is cellulitic with draining abscess.  As such, patient will be discharged with oral clindamycin.  I have discussed findings with the patient regarding infection.  He verbalizes understanding of same.  Patient again has a elevated glucose reading of 234.  I have discussed diabetes/prediabetes with the patient.  Elevated glucose reading may also be stress hyperglycemia.  Given this, once infection has been managed, patient is to follow-up with primary care for repeat glucose testing.  He verbalized understanding of same.  Patient will follow-up with podiatry in 2 days for recheck of foot  cellulitis.  If he is unable to see podiatry, patient will return to emergency department for wound check.   [JC]    Clinical Course User Index [JC] Cuthriell, Delorise RoyalsJonathan D, PA-C     Patient's diagnosis is consistent with toe abscess.  Patient presents emergency department with edema, erythema, pain of the third toe right foot.  On exam, patient has an opening, draining wound to the plantar aspect of the third digit.  Patient had pain, erythema extending into the metacarpal region.  Evaluation with labs and CT scan were undertaken.  No indication of sepsis.  No indication of infectious tenosynovitis or proximal abscess in the foot.  No indication of osteomyelitis.  Given crepitus of tissue, quick worsening of infection, I had attending provider evaluate the patient as well.  At this time, it is felt that the patient does not have necrotizing cellulitis.  As such, patient will be treated with oral antibiotics at home.  He will have close follow-up with the wound in 2 days with either  podiatry or return to the for reevaluation.  Of note, patient does have elevated blood glucose reading.  Patient does not have a history of diabetes.  Patient had not eaten recently either.  While this would normally be very concerning for diabetes, patient does have an appreciable infection.  I have discussed stress hyperglycemia with the patient as well.  Given the reading of 234 mg/dL patient will follow-up with primary care after infection has resolved for recheck of glucose..  Patient will be discharged with Norco and clindamycin.  Follow-up with podiatry or this department in 2 days for reevaluation.  Patient is given ED precautions to return to the ED for any worsening or new symptoms.     ____________________________________________  FINAL CLINICAL IMPRESSION(S) / ED DIAGNOSES  Final diagnoses:  Abscess of toe of right foot  Elevated glucose level      NEW MEDICATIONS STARTED DURING THIS VISIT:  ED Discharge Orders         Ordered    clindamycin (CLEOCIN) 300 MG capsule  4 times daily     08/27/18 2223    HYDROcodone-acetaminophen (NORCO/VICODIN) 5-325 MG tablet  Every 4 hours PRN     08/27/18 2223              This chart was dictated using voice recognition software/Dragon. Despite best efforts to proofread, errors can occur which can change the meaning. Any change was purely unintentional.    Racheal PatchesCuthriell, Jonathan D, PA-C 08/27/18 2224    Phineas SemenGoodman, Graydon, MD 08/27/18 2245

## 2018-08-27 NOTE — ED Notes (Signed)
Crutches for pt size not avail in ED, orderly has been called and crutches requested by this tech

## 2018-08-28 LAB — C-REACTIVE PROTEIN: CRP: 2.9 mg/dL — ABNORMAL HIGH (ref <1.0)

## 2018-08-29 ENCOUNTER — Encounter: Payer: Self-pay | Admitting: Emergency Medicine

## 2018-08-29 ENCOUNTER — Emergency Department
Admission: EM | Admit: 2018-08-29 | Discharge: 2018-08-29 | Disposition: A | Discharge status: Home / Self Care | Payer: Self-pay | Attending: Emergency Medicine | Admitting: Emergency Medicine

## 2018-08-29 ENCOUNTER — Other Ambulatory Visit: Payer: Self-pay

## 2018-08-29 DIAGNOSIS — Z5189 Encounter for other specified aftercare: Secondary | ICD-10-CM

## 2018-08-29 DIAGNOSIS — M79671 Pain in right foot: Secondary | ICD-10-CM | POA: Insufficient documentation

## 2018-08-29 DIAGNOSIS — I10 Essential (primary) hypertension: Secondary | ICD-10-CM | POA: Insufficient documentation

## 2018-08-29 LAB — GLUCOSE, CAPILLARY: Glucose-Capillary: 107 mg/dL — ABNORMAL HIGH (ref 70–99)

## 2018-08-29 NOTE — ED Triage Notes (Signed)
States he was seen 2 days ago for infection of right foot   States he was seen then for laceration.   Now here for recheck

## 2018-08-29 NOTE — ED Provider Notes (Signed)
[facility] Emergency Department Provider Note   ____________________________________________   First MD Initiated Contact with Patient 08/29/18 1212     (approximate)  I have reviewed the triage vital signs and the nursing notes.   HISTORY  Chief Complaint Foot Pain and Wound Check  HPI Gary Good is a [age] y.o. male presents to the ED for a recheck of his infected right foot.  She was seen in the ED on 08/27/2018 for an infection of his third digit right foot.  Patient was placed on clindamycin and states that his foot infection is greatly improved.  In looking over his last visit to the ED his blood sugar was elevated at 234 nonfasting.  Patient denies any history of diabetes.  He denies any fever, chills, nausea or vomiting.  Patient has had minimal drainage from the wound area and states that he has been cleaning it daily.  Also on his last visit his glucose fingerstick was elevated with the last test being elevated at 234.  Patient did not have a history of diabetes and this was felt to be stress related.   Past Medical History:  Diagnosis Date  . Hypertension     There are no active problems to display for this patient.   History reviewed. No pertinent surgical history.  Prior to Admission medications   Medication Sig Start Date End Date Taking? Authorizing Provider  clindamycin (CLEOCIN) 300 MG capsule Take 300 mg by mouth 3 (three) times daily.   Yes [provider]  HYDROcodone-acetaminophen (NORCO/VICODIN) 5-325 MG tablet Take 1 tablet by mouth every 6 (six) hours as needed for moderate pain.   Yes [provider]  clindamycin (CLEOCIN) 300 MG capsule Take 1 capsule (300 mg total) by mouth 4 (four) times daily. 08/27/18   Cuthriell, Delorise RoyalsJonathan D, PA-C  HYDROcodone-acetaminophen (NORCO/VICODIN) 5-325 MG tablet Take 1 tablet by mouth every 4 (four) hours as needed for moderate pain. 08/27/18   Cuthriell, Delorise RoyalsJonathan D, PA-C     Allergies Patient has no known allergies.  No family history on file.  Social History Social History   Tobacco Use  . Smoking status: Never Smoker  . Smokeless tobacco: Never Used  Substance Use Topics  . Alcohol use: No  . Drug use: No    Review of Systems Constitutional: No fever/chills Cardiovascular: Denies chest pain. Respiratory: Denies shortness of breath. Genitourinary: Negative for dysuria. Musculoskeletal: Positive for right foot pain. Skin: Positive for right foot infection. Neurological: Negative for headaches, focal weakness or numbness. ____________________________________________   PHYSICAL EXAM:  VITAL SIGNS: ED Triage Vitals  Enc Vitals Group     BP      Pulse      Resp      Temp      Temp src      SpO2      Weight      Height      Head Circumference      Peak Flow      Pain Score      Pain Loc      Pain Edu?      Excl. in GC?    Constitutional: Alert and oriented. Well appearing and in no acute distress. Eyes: Conjunctivae are normal.  Head: Atraumatic. Neck: No stridor.   Cardiovascular: Normal rate, regular rhythm. Grossly normal heart sounds.  Good peripheral circulation. Respiratory: Normal respiratory effort.  No retractions. Lungs CTAB. Musculoskeletal: On examination of the right foot there is no gross  deformity and there is minimal soft tissue swelling present.  On the plantar aspect of the third digit at the base there is a healing wound with minimal drainage.  Motor or sensory function intact.  Capillary refill is less than 3 seconds.  Area is without erythema or warmth to palpation.  No pitting edema present.  Patient states that this is much improved from his last visit and he currently continues taking antibiotics. Neurologic:  Normal speech and language. No gross focal neurologic deficits are appreciated. No gait instability. Skin:  Skin is warm, dry.  Healing wound right third digit as noted above. Psychiatric: Mood and  affect are normal. Speech and behavior are normal.  ____________________________________________   LABS (all labs ordered are listed, but only abnormal results are displayed)  Labs Reviewed  GLUCOSE, CAPILLARY - Abnormal; Notable for the following components:      Result Value   Glucose-Capillary 107 (*)    All other components within normal limits  CBG MONITORING, ED     PROCEDURES  Procedure(s) performed: None  Procedures  Critical Care performed: No  ____________________________________________   INITIAL IMPRESSION / ASSESSMENT AND PLAN / ED COURSE  As part of my medical decision making, I reviewed the following data within the electronic MEDICAL RECORD NUMBER Notes from prior ED visits and [place] Controlled Substance Database  Patient presents to the ED for wound recheck.  He was seen 2 days ago which time he was placed on clindamycin for an infection to his right third toe.  Patient states that area has improved greatly.  He denies any fever, chills, nausea or vomiting.  He continues to take clindamycin without any difficulties.  On examination area is without erythema or warmth.  There is no active drainage at this time.  Patient is to continue taking the antibiotic until completely finished.  At this time he will begin doing warm compresses or soaks once a day and continue cleaning his foot daily.  He states that the podiatry people are unable to see him until December 20.  He was given a list of clinics that he is eligible for since he does not have any  insurance.  ____________________________________________   FINAL CLINICAL IMPRESSION(S) / ED DIAGNOSES  Final diagnoses:  Visit for wound check     ED Discharge Orders    None       Note:  This document was prepared using Dragon voice recognition software and may include unintentional dictation errors.    Tommi Rumps, PA-C 08/29/18 1312    Emily Filbert, MD 08/29/18 [phone]

## 2018-08-29 NOTE — Discharge Instructions (Signed)
Continue to clean daily with mild soap and water.  Soak foot in warm water once a day.  Continue antibiotics until completely finished.  Elevate foot as needed for any swelling.  Also contact 1 of the clinics listed on your discharge papers.  [facility].  Also you may contact [place] [facility] and [facility] to establish being a patient.

## 2018-09-01 LAB — CULTURE, BLOOD (ROUTINE X 2)
CULTURE: NO GROWTH
Culture: NO GROWTH
Special Requests: ADEQUATE
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
## 2023-09-25 ENCOUNTER — Encounter (HOSPITAL_COMMUNITY): Payer: Self-pay

## 2023-09-25 ENCOUNTER — Emergency Department (HOSPITAL_COMMUNITY): Payer: Self-pay

## 2023-09-25 ENCOUNTER — Other Ambulatory Visit: Payer: Self-pay

## 2023-09-25 ENCOUNTER — Emergency Department (HOSPITAL_COMMUNITY)
Admission: EM | Admit: 2023-09-25 | Discharge: 2023-09-26 | Disposition: A | Discharge status: Home / Self Care | Payer: Self-pay | Attending: Emergency Medicine | Admitting: Emergency Medicine

## 2023-09-25 DIAGNOSIS — M25531 Pain in right wrist: Secondary | ICD-10-CM | POA: Insufficient documentation

## 2023-09-25 DIAGNOSIS — Y92410 Unspecified street and highway as the place of occurrence of the external cause: Secondary | ICD-10-CM | POA: Diagnosis not present

## 2023-09-25 DIAGNOSIS — I10 Essential (primary) hypertension: Secondary | ICD-10-CM | POA: Diagnosis not present

## 2023-09-25 MED ORDER — NAPROXEN 500 MG PO TABS: 500.0000 mg | ORAL_TABLET | Freq: Two times a day (BID) | ORAL | 0 refills | Status: AC

## 2023-09-25 MED ORDER — NAPROXEN 500 MG PO TABS
500.0000 mg | ORAL_TABLET | Freq: Once | ORAL | Status: AC
  Administered 2023-09-25: 500 mg via ORAL
  Filled 2023-09-25: qty 1

## 2023-09-25 MED ORDER — METHOCARBAMOL 500 MG PO TABS: 500.0000 mg | ORAL_TABLET | Freq: Two times a day (BID) | ORAL | 0 refills | Status: AC

## 2023-09-25 NOTE — Discharge Instructions (Addendum)
Please wear the wrist brace if you find it beneficial. The naprosyn is for inflammation. The Robaxin is a muscle relaxant. As discussed, do not take if you are going to drive or perform activities that require full attention as it may cause drowsiness. If your wrist pain does not improve you may follow up with orthopedics.
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
